# Patient Record
Sex: Female | Born: 2005 | Race: Black or African American | Hispanic: No | Smoking: Never smoker
Health system: Southern US, Community
[De-identification: ages and names within clinical notes are randomized; demographics above are authoritative.]

## PROBLEM LIST (undated history)

## (undated) ENCOUNTER — Inpatient Hospital Stay: Payer: Self-pay

## (undated) DIAGNOSIS — J45909 Unspecified asthma, uncomplicated: Secondary | ICD-10-CM

## (undated) HISTORY — PX: APPENDECTOMY: SHX54

---
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
## 2025-01-07 ENCOUNTER — Other Ambulatory Visit: Payer: Self-pay

## 2025-01-07 ENCOUNTER — Telehealth: Payer: Self-pay

## 2025-01-07 DIAGNOSIS — Z111 Encounter for screening for respiratory tuberculosis: Secondary | ICD-10-CM

## 2025-01-07 NOTE — Telephone Encounter (Signed)
Quantiferon order placed.

## 2025-01-07 NOTE — Telephone Encounter (Signed)
 Copied from CRM [id number]. 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  Please contact patient when submitted.  [phone]

## 2025-01-11 ENCOUNTER — Other Ambulatory Visit: Payer: Self-pay

## 2025-01-11 DIAGNOSIS — Z1159 Encounter for screening for other viral diseases: Secondary | ICD-10-CM

## 2025-01-12 ENCOUNTER — Ambulatory Visit

## 2025-01-14 ENCOUNTER — Other Ambulatory Visit: Payer: Self-pay

## 2025-01-14 DIAGNOSIS — L219 Seborrheic dermatitis, unspecified: Secondary | ICD-10-CM

## 2025-01-14 DIAGNOSIS — Z3009 Encounter for other general counseling and advice on contraception: Secondary | ICD-10-CM

## 2025-01-14 MED ORDER — KETOCONAZOLE 2 % EX SHAM: 1.0000 | MEDICATED_SHAMPOO | CUTANEOUS | 0 refills | Status: AC

## 2025-01-14 MED ORDER — JUNEL FE 1/20 1-20 MG-MCG PO TABS: 1.0000 | ORAL_TABLET | Freq: Every day | ORAL | 3 refills | Status: AC

## 2025-01-14 NOTE — Telephone Encounter (Signed)
 Copied from CRM [id number]. Topic: Clinical - Medication Question >> Jan 14, 2025  2:25 PM Delon T wrote: Reason for CRM: was not able to pick up prescriptions that were called in on 12/21 and 1/1, please call the pharmacy to have them filled- ketoconazole  (NIZORAL ) 2 % shampoo norethindrone-ethinyl estradiol-FE (JUNEL  FE 1/20) 1-20 MG-MCG tablet triamcinolone  ointment (KENALOG ) 0.1 % Please call back in to [facility]

## 2025-01-19 ENCOUNTER — Ambulatory Visit: Payer: Self-pay

## 2025-01-19 LAB — HEPATITIS B SURFACE ANTIBODY,QUALITATIVE: Hep B Surface Ab, Qual: NONREACTIVE

## 2025-01-24 ENCOUNTER — Ambulatory Visit

## 2025-01-27 ENCOUNTER — Ambulatory Visit (INDEPENDENT_AMBULATORY_CARE_PROVIDER_SITE_OTHER)

## 2025-01-27 DIAGNOSIS — Z23 Encounter for immunization: Secondary | ICD-10-CM | POA: Diagnosis not present

## 2025-01-27 NOTE — Progress Notes (Signed)
 Patient is in office today for a nurse visit for Immunization. Patient Injection was given in the  Right deltoid. Patient tolerated injection well. Patient advised to return in 1 month for second and final vaccine

## 2025-02-28 ENCOUNTER — Ambulatory Visit

## 2025-08-26 ENCOUNTER — Ambulatory Visit
